# Patient Record
Sex: Female | Born: 1980 | Race: Black or African American | Hispanic: No | Marital: Single | Smoking: Never smoker
Health system: Southern US, Community
[De-identification: ages and names within clinical notes are randomized; demographics above are authoritative.]

## PROBLEM LIST (undated history)

## (undated) DIAGNOSIS — O09529 Supervision of elderly multigravida, unspecified trimester: Secondary | ICD-10-CM

## (undated) DIAGNOSIS — D649 Anemia, unspecified: Secondary | ICD-10-CM

## (undated) HISTORY — PX: BONY PELVIS SURGERY: SHX572

---

## 2000-10-06 HISTORY — PX: WRIST SURGERY: SHX841

## 2001-01-25 ENCOUNTER — Encounter: Payer: Self-pay | Admitting: Orthopedic Surgery

## 2001-01-25 ENCOUNTER — Ambulatory Visit (HOSPITAL_COMMUNITY): Admission: RE | Admit: 2001-01-25 | Discharge: 2001-01-25 | Payer: Self-pay | Admitting: Orthopedic Surgery

## 2001-03-09 ENCOUNTER — Encounter: Payer: Self-pay | Admitting: Orthopedic Surgery

## 2001-03-10 ENCOUNTER — Inpatient Hospital Stay (HOSPITAL_COMMUNITY): Admission: EM | Admit: 2001-03-10 | Discharge: 2001-03-11 | Payer: Self-pay | Admitting: Orthopedic Surgery

## 2002-07-12 ENCOUNTER — Encounter: Payer: Self-pay | Admitting: Emergency Medicine

## 2002-07-12 ENCOUNTER — Emergency Department (HOSPITAL_COMMUNITY): Admission: EM | Admit: 2002-07-12 | Discharge: 2002-07-12 | Payer: Self-pay | Admitting: Emergency Medicine

## 2004-01-06 ENCOUNTER — Ambulatory Visit (HOSPITAL_COMMUNITY): Admission: RE | Admit: 2004-01-06 | Discharge: 2004-01-06 | Payer: Self-pay | Admitting: Family Medicine

## 2004-02-29 ENCOUNTER — Emergency Department (HOSPITAL_COMMUNITY): Admission: EM | Admit: 2004-02-29 | Discharge: 2004-03-01 | Payer: Self-pay | Admitting: Emergency Medicine

## 2005-03-27 ENCOUNTER — Other Ambulatory Visit: Admission: RE | Admit: 2005-03-27 | Discharge: 2005-03-27 | Payer: Self-pay | Admitting: Obstetrics and Gynecology

## 2005-06-05 ENCOUNTER — Emergency Department (HOSPITAL_COMMUNITY): Admission: EM | Admit: 2005-06-05 | Discharge: 2005-06-05 | Payer: Self-pay | Admitting: Emergency Medicine

## 2005-10-09 ENCOUNTER — Emergency Department (HOSPITAL_COMMUNITY): Admission: EM | Admit: 2005-10-09 | Discharge: 2005-10-10 | Payer: Self-pay | Admitting: Emergency Medicine

## 2007-05-07 ENCOUNTER — Emergency Department (HOSPITAL_COMMUNITY): Admission: EM | Admit: 2007-05-07 | Discharge: 2007-05-07 | Payer: Self-pay | Admitting: Emergency Medicine

## 2011-02-21 NOTE — Discharge Summary (Signed)
[facility]  Patient:    Jessica Bryant, Jessica Bryant                          MRN: [id number] Adm. Date:  [id number] Disc. 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  PROCEDURES: 1. Left brachioradialis tenotomy. 2. Left posterior interosseous nerve neurectomy. 3. Left radius corrective osteotomy with opening wedge bone graft. 4. Left iliac crest bone graft by Dr. Amanda Pea on March 09, 2001, with Cherly Beach, P.A.-C assisting.  CONSULTATIONS:  None.  CHIEF COMPLAINT:  Left wrist pain.  HISTORY OF PRESENT ILLNESS:  The patient is a 30 year old female who had worsening left wrist pain secondary to an old injury.  She had failed conservative measures and was continuing to have discomfort in that wrist. After discussion with Dr. Amanda Pea, the patient elected to undergo left wrist corrective procedures.  She was admitted on March 09, 2001, for surgical intervention.  HOSPITAL COURSE:  Following the surgical procedure, the patient was taken to the recovery room in stable condition and then transferred to the orthopedic floor in good condition.  She did well.  On postop day #1, her left wrist drains were discontinued.  On postop day #2, the drain to her left iliac crest was withdrawn and incision was examined and found to be clean, dry and intact. Occupational therapy was consulted, evaluated and treated the patient on the first postoperative day instructing her on active and passive range of motion of her fingers.  She was initially on a Dilaudid PCA for pain management and subsequently p.o. Percocet in which she tolerated well.  She received three does of Ancef 1 g IV postop.  There were no postoperative labs.  By postop day #2, she was stable and ready for discharge  home.  DISPOSITION:  Discharged to home.  SPECIAL INSTRUCTIONS:  She is to follow cast care and splint care instructions.  She is to keep the splint clean, dry and intact.  It is not to be removed.  She is instructed on ice and elevation.  No lifting with that arm.  It is okay for her to move her fingers and she is encouraged to do so.  FOLLOWUP:  She is to follow up with Dr. Amanda Pea in 10-14 days.  She is to call [phone], for an appointment.  DIET:  Resume regular diet.  CONDITION ON DISCHARGE:  Good and improved. DD:  03/11/01 TD:  03/12/01 Job: [id number]

## 2011-02-21 NOTE — Op Note (Signed)
[facility]  Patient:    Jessica Bryant, Jessica Bryant                          MRN: [id number] Proc. Date: 03/09/01 Adm. 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  PROCEDURE: 1. Left brachial radialis tendon tenotomy. 2. Left posterior interosseous nerve neurectomy. 3. Left radius corrective osteotomy with opening wedge bone graft. 4. Left iliac crest bone graft autologous in nature for the opening wedge    bone grafting.  SURGEON:  Dominica Severin, M.D.  ASSISTANT:  Jessica Bryant, P.A.-C.  COMPLICATIONS:  None.  TOURNIQUET TIME:  Less than two hours.  ANESTHESIA:  General.  DRAINS:  Two in both the left hip and left wrist operative sites.  INDICATIONS FOR PROCEDURE:  This patient is a 30 year old black female who presents with the above mentioned diagnosis. I have counselled her extensively in regards to the surgery including the risks and benefits of bleeding, infection, anesthesia, damage to normal structures and failure of the surgery to accomplish its intended goals of relieving symptoms and restoring function. With this in mind, she desires to proceed. We have obtained preoperative CT and plain film. I have discussed with she and the family that this will likely be a stage operation with correction of the radius. Following this, we will allow this to heal. She will need subsequent plate removal and we will consider shortening osteotomy at that time if she should need it. I have discussed with them the risk of a tendon rupture due to plate prominence, etc. They do understand the plate will need to come out in three to six months after healing. They  understand the risk of the iliac bone graft, etc. We have had a long and detailed counselling session in regards to all issues. The patient and her family desire to proceed.  DESCRIPTION OF PROCEDURE:  The patient had a malunion in the left radius. She underwent correction as described below. I was happy with her motion on the table. She had excellent stability. Under live fluoro, there was no displacement and good purchase of her plate and screws. She had ample bone graft placed and good stability. She achieved good correction of her radial inclination and volar tilt, radial height remained at a 3 mm ulnar positive variance at the end. This was quite a dramatic improvement from her preoperative status.  DESCRIPTION OF PROCEDURE:  The patient was seen by myself and anesthesia. She was taken to the operative suite and underwent preoperative antibiotics and was then laid supine, smooth induction of general endotracheal anesthesia ensued. Following this, she was appropriately padded and the left hip and left upper extremity were prepped and draped in the usual sterile fashion. Once this was done, the tourniquet was insufflated to 270 mmHg approximately after the arm was elevated. A midline incision was made. Access to the interval between the third and fourth dorsal compartment was created. Once this was done, the patient had dissection over to the brachial radialis which was quite contracted. This underwent a tenotomy in  the form of a Z type lengthening. Following this tenotomy, the patient underwent a posterior interosseous nerve neurectomy using crushing and cauterization technique. Following this, the patient underwent access to the radius between the third and fourth dorsal compartments. The EPL was transposed, the extensor retinaculum was Z lengthened to allow for coverage of the plate later in the operation. Once this was done, the patient had the radius identified, retractors were  placed and outline marks were made for the corrective osteotomy site. Two pins were placed along the articular surface of the distal radius under live fluoro. Once this was done, the osteotomy cut was made to provide ample room for screws distally. The patient then had the opening wedge performed. I should note that the osteotomy was made through the dorsal and medial and lateral sides of the radius. The volar cortex was not completely osteotomized. This fits with standard technique for corrective osteotomy about the radius. I took care to leave a volar bridge of periosteum for stability purposes. Once this was done, I checked under live fluoro the correction and the correction was excellent in terms of volar tilt and radial inclination. She had some ulnar positive variant still left but quite a dramatic improvement was noted.  Once this was done, the patient had the iliac crest approached. This was done by incising the skin. A large amount of subcu was dissected until the fascia was identified and it was then split and iliac crest bone graft was taken with standard technique. The bone graft was taken utilizing the oscillating saw as well as the osteotomy and curette. The patient had nice tricortical bone graft as well as cancellous bone graft taken without difficulty. Following this, this was irrigated. An ______ Hemovac drain was placed, bone wax, Gelfoam with thrombin and layered closure with #0 Vicryl, 2-0 Vicryl and the staple gun was performed. This was done I should note at the very end of the case after the plate was applied to the radius. Her hemostasis was excellent and I was very happy with the overall conditions of her hip. However, I should note that she did have a large amount of adipose tissue and we will need to watch her for any donor site morbidity, etc. I discussed this with them preoperatively.  Following harvesting the iliac crest bone graft, the tricortical wedge  was shapened with the oscillating saw rongeur, etc. and then the lamina spreader was placed and the corrective osteotomy site had placement of the tricortical  bone graft around it. The patient had packing of large amounts of cancellous bone graft done to my satisfaction without difficulty. I was very pleased with the amount of bone graft able to be placed in the area and the correction. Following this, a small LO- ______ plate was applied, screws were placed proximally and distally and achieved excellent bicortical purchase utilizing standard AO technique. I was pleased radiographically with the correction plate fit, etc. This plate was bending appropriately and fine adjustments were made accordingly. Once the plate was applied, the patient underwent live fluoroscopy to demonstrate stability. This was noted to be the case. Volar tilt had been restored to an excellent position. Radial inclination had also been restored. She had some 2-3 mm ulnar positive variance but certainly a dramatic correction from preoperative status. I was very satisfied at this point. The patient had hemostasis obtained with bipolar electrocautery and then underwent closure of the extensor retinaculum over the plate. This was previously Z lengthened. The plate was covered  distally entirely with retinaculum to prevent ______ tendon rupture, etc. The EPL was left transposed and sat away from all elements of the plate. The ______ compartment was protected from the plate. The ECRB sat somewhat adjacent to the plate; however, it had a nice layer of tissue between it and the plate and hopefully there will be no problems with tendon gliding, etc. postoperatively. Once the periosteal areas and the retinaculum were closed, the patient underwent copious irrigation followed by leaving the EPL in the transposed state and closing the wound over a TLS drain with 2-0 Vicryl followed by Prolene at the skin edge. The drain was  hooked up to suction. Marcaine with epinephrine was placed in the hip wound. A 0.5% and 0.25% Marcaine without epinephrine was placed in the left hand/wrist area. The patient tolerated this well. A sterile compressive dressing and plaster split was applied without difficulty to the left upper extremity. She was awakened from anesthesia, extubated and transferred to the recovery room in stable condition at the conclusion of the case. She will be monitored postoperatively with IV antibiotics, pain management, etc. She will notify me should any problems, questions or concerns arise. We will monitor her condition closely. I have discussed all issues with the family and should note that I was pleased with her correction intraoperatively. All questions have been encouraged and answered. DD:  03/09/01 TD:  03/10/01 Job: [id number]

## 2017-10-06 NOTE — L&D Delivery Note (Signed)
Delivery Note  Pt complete with uncontrollable urge to push. She pushed for 30-40 mins and at 11:00 PM a viable female was delivered via Vaginal, Spontaneous (Presentation: OA;  ).  APGAR: 9,9 ; weight pending  .   Placenta status: delivered intact, schultz, .  Cord:3vc  with the following complications: none;  Cord pH: n/a  Anesthesia:  Epidural Episiotomy: None Lacerations:  none Est. Blood Loss (mL): 150  Mom to postpartum.  Baby to Couplet care / Skin to Skin.  Cathrine MusterCecilia W Travor Royce 04/21/2018, 11:20 PM

## 2017-10-19 LAB — OB RESULTS CONSOLE ABO/RH: RH TYPE: POSITIVE

## 2017-10-19 LAB — OB RESULTS CONSOLE HIV ANTIBODY (ROUTINE TESTING): HIV: NONREACTIVE

## 2017-10-19 LAB — OB RESULTS CONSOLE RPR: RPR: NONREACTIVE

## 2017-10-19 LAB — OB RESULTS CONSOLE ANTIBODY SCREEN: Antibody Screen: NEGATIVE

## 2017-10-19 LAB — OB RESULTS CONSOLE GC/CHLAMYDIA
Chlamydia: NEGATIVE
Gonorrhea: NEGATIVE

## 2017-10-19 LAB — OB RESULTS CONSOLE HEPATITIS B SURFACE ANTIGEN: Hepatitis B Surface Ag: NEGATIVE

## 2017-10-19 LAB — OB RESULTS CONSOLE RUBELLA ANTIBODY, IGM: Rubella: IMMUNE

## 2017-10-22 DIAGNOSIS — D649 Anemia, unspecified: Secondary | ICD-10-CM | POA: Insufficient documentation

## 2017-10-25 ENCOUNTER — Other Ambulatory Visit: Payer: Self-pay

## 2017-11-23 ENCOUNTER — Other Ambulatory Visit (HOSPITAL_COMMUNITY): Payer: Self-pay | Admitting: Obstetrics and Gynecology

## 2017-11-23 DIAGNOSIS — O09522 Supervision of elderly multigravida, second trimester: Secondary | ICD-10-CM

## 2017-11-23 DIAGNOSIS — O09292 Supervision of pregnancy with other poor reproductive or obstetric history, second trimester: Secondary | ICD-10-CM

## 2017-11-23 DIAGNOSIS — Z3689 Encounter for other specified antenatal screening: Secondary | ICD-10-CM

## 2017-11-23 DIAGNOSIS — Z3A19 19 weeks gestation of pregnancy: Secondary | ICD-10-CM

## 2017-11-26 ENCOUNTER — Encounter (HOSPITAL_COMMUNITY): Payer: Self-pay | Admitting: Obstetrics and Gynecology

## 2017-12-01 ENCOUNTER — Encounter (HOSPITAL_COMMUNITY): Payer: Self-pay | Admitting: *Deleted

## 2017-12-03 ENCOUNTER — Encounter (HOSPITAL_COMMUNITY): Payer: Self-pay

## 2017-12-03 ENCOUNTER — Other Ambulatory Visit: Payer: Self-pay

## 2017-12-03 ENCOUNTER — Ambulatory Visit (HOSPITAL_COMMUNITY)
Admission: RE | Admit: 2017-12-03 | Discharge: 2017-12-03 | Disposition: A | Discharge status: Home / Self Care | Payer: Medicaid Other | Source: Ambulatory Visit | Attending: Obstetrics and Gynecology | Admitting: Obstetrics and Gynecology

## 2017-12-03 ENCOUNTER — Other Ambulatory Visit (HOSPITAL_COMMUNITY): Payer: Self-pay | Admitting: Obstetrics and Gynecology

## 2017-12-03 DIAGNOSIS — O09512 Supervision of elderly primigravida, second trimester: Secondary | ICD-10-CM | POA: Diagnosis not present

## 2017-12-03 DIAGNOSIS — Z3689 Encounter for other specified antenatal screening: Secondary | ICD-10-CM

## 2017-12-03 DIAGNOSIS — O289 Unspecified abnormal findings on antenatal screening of mother: Secondary | ICD-10-CM | POA: Insufficient documentation

## 2017-12-03 DIAGNOSIS — Z315 Encounter for genetic counseling: Secondary | ICD-10-CM | POA: Insufficient documentation

## 2017-12-03 DIAGNOSIS — Z3A19 19 weeks gestation of pregnancy: Secondary | ICD-10-CM | POA: Insufficient documentation

## 2017-12-03 DIAGNOSIS — O09292 Supervision of pregnancy with other poor reproductive or obstetric history, second trimester: Secondary | ICD-10-CM

## 2017-12-03 DIAGNOSIS — O09522 Supervision of elderly multigravida, second trimester: Secondary | ICD-10-CM

## 2017-12-03 DIAGNOSIS — O281 Abnormal biochemical finding on antenatal screening of mother: Secondary | ICD-10-CM

## 2017-12-03 DIAGNOSIS — O99212 Obesity complicating pregnancy, second trimester: Secondary | ICD-10-CM | POA: Insufficient documentation

## 2017-12-03 DIAGNOSIS — Z3686 Encounter for antenatal screening for cervical length: Secondary | ICD-10-CM

## 2017-12-03 DIAGNOSIS — O09519 Supervision of elderly primigravida, unspecified trimester: Secondary | ICD-10-CM

## 2017-12-03 HISTORY — DX: Anemia, unspecified: D64.9

## 2017-12-04 DIAGNOSIS — O09519 Supervision of elderly primigravida, unspecified trimester: Secondary | ICD-10-CM | POA: Insufficient documentation

## 2017-12-04 DIAGNOSIS — Z3A19 19 weeks gestation of pregnancy: Secondary | ICD-10-CM | POA: Insufficient documentation

## 2017-12-04 NOTE — Progress Notes (Signed)
Genetic Counseling  High-Risk Gestation Note  Appointment Date:  12/03/2017 Referred By: Edwinna Areola, * Date of Birth:  Mar 01, 1981 Partner:  Jessica Bryant   Pregnancy History: G1P0 Estimated Date of Delivery: 04/29/18 Estimated Gestational Age: [redacted]w[redacted]d Attending: Particia Nearing, MD   Ms. Jessica Bryant was referred for genetic counseling given advanced maternal age and given no results on noninvasive prenatal screening (NIPS) due to insufficient fetal DNA.    In summary:   Discussed AMA and associated risk for fetal aneuploidy  Reviewed previous NIPS attempts and low fetal fraction  Reviewed various etiologies for low fetal fraction including gestational age, maternal BMI, maternal medication use, underlying fetal aneuploidy and other unknown factors  Discussed options for screening  Quad screen-declined  Repeat NIPS- declined today  Ultrasound- performed today; see separate report  Discussed diagnostic testing options  Amniocentesis- declined  Reviewed family history concerns   She was counseled regarding maternal age and the association with risk for chromosome conditions due to nondisjunction with aging of the ova.   We reviewed chromosomes, nondisjunction, and the associated 1 in 111 risk for fetal aneuploidy related to a maternal age of 37 y.o. at [redacted]w[redacted]d gestation.  She was counseled that the risk for aneuploidy decreases as gestational age increases, accounting for those pregnancies which spontaneously abort.  We specifically discussed Down syndrome (trisomy 55), trisomies 27 and 77, and sex chromosome aneuploidies (47,XXX and 47,XXY) including the common features and prognoses of each.   Ms. Jessica Bryant previously had noninvasive prenatal screening (NIPS)/cell free DNA testing attempted through her OB office at 13 weeks and [redacted]w[redacted]d gestation. Both were reported to have a low fetal fraction (2.6% and 2.7%), and thus, no result was obtained from this screening. We reviewed the  methodology of NIPS and discussed differential diagnoses for low fetal fraction including maternal obesity, physiological differences in maternal blood, maternal use of anticoagulants in pregnancy, underlying maternal or fetal aneuploidy. Additionally, we discussed that there may be additional factors that impact fetal fraction in pregnancy that are not yet reported in the medical literature. We discussed that on average, fetal fraction is expected to increase incrementally throughout gestation.    We reviewed available screening options including repeat NIPS given the further gestation but also discussed that there still remains a possibility of detecting a low fetal fraction. We also discussed screening options of Quad screen and detailed ultrasound.  She was counseled that screening tests are used to modify a patient's a priori risk for aneuploidy, typically based on age. This estimate provides a pregnancy specific risk assessment. We reviewed the benefits and limitations of each option. Specifically, we discussed the conditions for which each test screens, the detection rates, and false positive rates of each. She was also counseled regarding diagnostic testing via amniocentesis. We reviewed the approximate 1 in 300-500 risk for complications from amniocentesis, including spontaneous pregnancy loss. We discussed the possible results that the tests might provide including: positive, negative, unanticipated, and no result. Finally, they were counseled regarding the cost of each option and potential out of pocket expenses. After consideration of all the options, she elected to pursue ultrasound only and declined all additional screening and testing for fetal aneuploidy including NIPS redraw, Quad screen, and amniocentesis.     A complete ultrasound was performed today. The ultrasound report will be sent under separate cover. She understands that screening tests cannot rule out all birth defects or genetic  syndromes. The patient was advised of this limitation and states she still does  not want additional testing at this time.   The patient reported a family history of sickle cell on her father's side of the family. We reviewed that hemoglobin electrophoresis was previously performed for MS. Jessica Bryant and was within normal limits, indicating that the current pregnancy is not  At increased risk for hemoglobinopathies. Detailed pedigree Holiday representativeconstruction and analysis was not able to be performed today given time constraints on today's visit. The patient reported African American ancestry for herself and her partner, and no known consanguinity for the couple. Without further information regarding the provided family history, an accurate genetic risk cannot be calculated. Further genetic counseling is warranted if more information is obtained.  Ms. Jessica Bryant denied exposure to environmental toxins or chemical agents. She denied the use of alcohol, tobacco or street drugs. She denied significant viral illnesses during the course of her pregnancy.   I counseled Ms. Jessica Bryant regarding the above risks and available options.  The approximate face-to-face time with the genetic counselor was 40 minutes.  Quinn PlowmanKaren Saxon Barich, MS,  Certified Genetic Counselor 12/04/2017

## 2017-12-10 ENCOUNTER — Ambulatory Visit (HOSPITAL_COMMUNITY): Payer: Self-pay

## 2018-03-22 LAB — OB RESULTS CONSOLE GBS: STREP GROUP B AG: NEGATIVE

## 2018-04-05 ENCOUNTER — Encounter (HOSPITAL_COMMUNITY): Payer: Self-pay

## 2018-04-06 ENCOUNTER — Encounter (HOSPITAL_COMMUNITY): Payer: Self-pay

## 2018-04-19 ENCOUNTER — Encounter (HOSPITAL_COMMUNITY)
Admission: RE | Admit: 2018-04-19 | Discharge: 2018-04-19 | Disposition: A | Discharge status: Home / Self Care | Payer: Medicaid Other | Source: Ambulatory Visit | Attending: Obstetrics and Gynecology | Admitting: Obstetrics and Gynecology

## 2018-04-19 HISTORY — DX: Supervision of elderly multigravida, unspecified trimester: O09.529

## 2018-04-20 ENCOUNTER — Encounter (HOSPITAL_COMMUNITY): Admission: RE | Payer: Self-pay | Source: Ambulatory Visit

## 2018-04-20 ENCOUNTER — Inpatient Hospital Stay (HOSPITAL_COMMUNITY)
Admission: RE | Admit: 2018-04-20 | Payer: Medicaid Other | Source: Ambulatory Visit | Admitting: Obstetrics and Gynecology

## 2018-04-20 SURGERY — Surgical Case
Anesthesia: Spinal

## 2018-04-21 ENCOUNTER — Encounter (HOSPITAL_COMMUNITY): Payer: Self-pay

## 2018-04-21 ENCOUNTER — Inpatient Hospital Stay (HOSPITAL_COMMUNITY): Payer: Medicaid Other | Admitting: Anesthesiology

## 2018-04-21 ENCOUNTER — Inpatient Hospital Stay (HOSPITAL_COMMUNITY)
Admission: RE | Admit: 2018-04-21 | Discharge: 2018-04-23 | DRG: 807 | Disposition: A | Discharge status: Home / Self Care | Payer: Medicaid Other | Attending: Obstetrics and Gynecology | Admitting: Obstetrics and Gynecology

## 2018-04-21 DIAGNOSIS — O99214 Obesity complicating childbirth: Principal | ICD-10-CM | POA: Diagnosis present

## 2018-04-21 DIAGNOSIS — O09519 Supervision of elderly primigravida, unspecified trimester: Secondary | ICD-10-CM

## 2018-04-21 DIAGNOSIS — Z3490 Encounter for supervision of normal pregnancy, unspecified, unspecified trimester: Secondary | ICD-10-CM

## 2018-04-21 DIAGNOSIS — O26893 Other specified pregnancy related conditions, third trimester: Secondary | ICD-10-CM | POA: Diagnosis present

## 2018-04-21 DIAGNOSIS — D649 Anemia, unspecified: Secondary | ICD-10-CM | POA: Diagnosis present

## 2018-04-21 DIAGNOSIS — Z3A39 39 weeks gestation of pregnancy: Secondary | ICD-10-CM | POA: Diagnosis not present

## 2018-04-21 DIAGNOSIS — O9902 Anemia complicating childbirth: Secondary | ICD-10-CM | POA: Diagnosis present

## 2018-04-21 LAB — TYPE AND SCREEN
ABO/RH(D): A POS
ANTIBODY SCREEN: NEGATIVE

## 2018-04-21 LAB — CBC
HCT: 34.3 % — ABNORMAL LOW (ref 36.0–46.0)
HEMOGLOBIN: 11.1 g/dL — AB (ref 12.0–15.0)
MCH: 26.4 pg (ref 26.0–34.0)
MCHC: 32.4 g/dL (ref 30.0–36.0)
MCV: 81.7 fL (ref 78.0–100.0)
Platelets: 214 10*3/uL (ref 150–400)
RBC: 4.2 MIL/uL (ref 3.87–5.11)
RDW: 15.8 % — ABNORMAL HIGH (ref 11.5–15.5)
WBC: 8.7 10*3/uL (ref 4.0–10.5)

## 2018-04-21 LAB — RPR: RPR Ser Ql: NONREACTIVE

## 2018-04-21 LAB — ABO/RH: ABO/RH(D): A POS

## 2018-04-21 MED ORDER — EPHEDRINE 5 MG/ML INJ
10.0000 mg | INTRAVENOUS | Status: DC | PRN
  Filled 2018-04-21: qty 2

## 2018-04-21 MED ORDER — FENTANYL 2.5 MCG/ML BUPIVACAINE 1/10 % EPIDURAL INFUSION (WH - ANES)
14.0000 mL/h | INTRAMUSCULAR | Status: DC | PRN
  Administered 2018-04-21: 15 mL/h via EPIDURAL
  Administered 2018-04-21: 14 mL/h via EPIDURAL
  Filled 2018-04-21 (×2): qty 100

## 2018-04-21 MED ORDER — OXYTOCIN 40 UNITS IN LACTATED RINGERS INFUSION - SIMPLE MED
1.0000 m[IU]/min | INTRAVENOUS | Status: DC
  Administered 2018-04-21: 2 m[IU]/min via INTRAVENOUS

## 2018-04-21 MED ORDER — OXYTOCIN BOLUS FROM INFUSION
500.0000 mL | Freq: Once | INTRAVENOUS | Status: AC
  Administered 2018-04-21: 500 mL via INTRAVENOUS

## 2018-04-21 MED ORDER — TERBUTALINE SULFATE 1 MG/ML IJ SOLN
0.2500 mg | Freq: Once | INTRAMUSCULAR | Status: DC | PRN
  Filled 2018-04-21: qty 1

## 2018-04-21 MED ORDER — DIPHENHYDRAMINE HCL 50 MG/ML IJ SOLN: 12.5000 mg | INTRAMUSCULAR | Status: DC | PRN

## 2018-04-21 MED ORDER — FLEET ENEMA 7-19 GM/118ML RE ENEM: 1.0000 | ENEMA | RECTAL | Status: DC | PRN

## 2018-04-21 MED ORDER — BUTORPHANOL TARTRATE 1 MG/ML IJ SOLN
1.0000 mg | INTRAMUSCULAR | Status: DC | PRN
  Administered 2018-04-21: 1 mg via INTRAVENOUS
  Filled 2018-04-21: qty 1

## 2018-04-21 MED ORDER — LACTATED RINGERS IV SOLN
500.0000 mL | INTRAVENOUS | Status: DC | PRN
  Administered 2018-04-21: 500 mL via INTRAVENOUS

## 2018-04-21 MED ORDER — OXYCODONE-ACETAMINOPHEN 5-325 MG PO TABS: 1.0000 | ORAL_TABLET | ORAL | Status: DC | PRN

## 2018-04-21 MED ORDER — OXYTOCIN 40 UNITS IN LACTATED RINGERS INFUSION - SIMPLE MED
2.5000 [IU]/h | INTRAVENOUS | Status: DC
  Filled 2018-04-21: qty 1000

## 2018-04-21 MED ORDER — ACETAMINOPHEN 325 MG PO TABS: 650.0000 mg | ORAL_TABLET | ORAL | Status: DC | PRN

## 2018-04-21 MED ORDER — IBUPROFEN 600 MG PO TABS
600.0000 mg | ORAL_TABLET | Freq: Four times a day (QID) | ORAL | Status: DC
  Administered 2018-04-22 – 2018-04-23 (×5): 600 mg via ORAL
  Filled 2018-04-21 (×5): qty 1

## 2018-04-21 MED ORDER — LIDOCAINE HCL (PF) 1 % IJ SOLN
INTRAMUSCULAR | Status: DC | PRN
  Administered 2018-04-21: 5 mL via EPIDURAL
  Administered 2018-04-21: 4 mL via EPIDURAL

## 2018-04-21 MED ORDER — LACTATED RINGERS IV SOLN: 500.0000 mL | Freq: Once | INTRAVENOUS | Status: DC

## 2018-04-21 MED ORDER — LACTATED RINGERS AMNIOINFUSION
Freq: Once | INTRAVENOUS | Status: AC
  Administered 2018-04-21: 300 mL via INTRAUTERINE

## 2018-04-21 MED ORDER — ONDANSETRON HCL 4 MG/2ML IJ SOLN: 4.0000 mg | Freq: Four times a day (QID) | INTRAMUSCULAR | Status: DC | PRN

## 2018-04-21 MED ORDER — OXYCODONE-ACETAMINOPHEN 5-325 MG PO TABS: 2.0000 | ORAL_TABLET | ORAL | Status: DC | PRN

## 2018-04-21 MED ORDER — PHENYLEPHRINE 40 MCG/ML (10ML) SYRINGE FOR IV PUSH (FOR BLOOD PRESSURE SUPPORT)
80.0000 ug | PREFILLED_SYRINGE | INTRAVENOUS | Status: DC | PRN
  Filled 2018-04-21: qty 5

## 2018-04-21 MED ORDER — LACTATED RINGERS IV SOLN
500.0000 mL | Freq: Once | INTRAVENOUS | Status: AC
  Administered 2018-04-21: 500 mL via INTRAVENOUS

## 2018-04-21 MED ORDER — PHENYLEPHRINE 40 MCG/ML (10ML) SYRINGE FOR IV PUSH (FOR BLOOD PRESSURE SUPPORT)
80.0000 ug | PREFILLED_SYRINGE | INTRAVENOUS | Status: DC | PRN
  Filled 2018-04-21: qty 10
  Filled 2018-04-21: qty 5

## 2018-04-21 MED ORDER — LIDOCAINE HCL (PF) 1 % IJ SOLN
30.0000 mL | INTRAMUSCULAR | Status: DC | PRN
  Filled 2018-04-21: qty 30

## 2018-04-21 MED ORDER — SOD CITRATE-CITRIC ACID 500-334 MG/5ML PO SOLN: 30.0000 mL | ORAL | Status: DC | PRN

## 2018-04-21 MED ORDER — LACTATED RINGERS IV SOLN
INTRAVENOUS | Status: DC
  Administered 2018-04-21 (×3): via INTRAVENOUS

## 2018-04-21 NOTE — Anesthesia Preprocedure Evaluation (Signed)
Anesthesia Evaluation  Patient identified by MRN, date of birth, ID band Patient awake    Reviewed: Allergy & Precautions, Patient's Chart, lab work & pertinent test results  Airway Mallampati: II  TM Distance: >3 FB Neck ROM: Full    Dental no notable dental hx. (+) Teeth Intact   Pulmonary neg pulmonary ROS,    Pulmonary exam normal breath sounds clear to auscultation       Cardiovascular hypertension, Normal cardiovascular exam Rhythm:Regular Rate:Normal     Neuro/Psych negative neurological ROS  negative psych ROS   GI/Hepatic Neg liver ROS,   Endo/Other  Morbid obesitySuper MO  Renal/GU negative Renal ROS  negative genitourinary   Musculoskeletal negative musculoskeletal ROS (+)   Abdominal (+) + obese,   Peds  Hematology  (+) anemia ,   Anesthesia Other Findings   Reproductive/Obstetrics (+) Pregnancy AMA                             Anesthesia Physical Anesthesia Plan  ASA: III  Anesthesia Plan: Epidural   Post-op Pain Management:    Induction:   PONV Risk Score and Plan:   Airway Management Planned: Natural Airway  Additional Equipment:   Intra-op Plan:   Post-operative Plan:   Informed Consent: I have reviewed the patients History and Physical, chart, labs and discussed the procedure including the risks, benefits and alternatives for the proposed anesthesia with the patient or authorized representative who has indicated his/her understanding and acceptance.     Plan Discussed with: Anesthesiologist  Anesthesia Plan Comments:         Anesthesia Quick Evaluation

## 2018-04-21 NOTE — H&P (Signed)
Jessica Bryant is a 37 y.o. female presenting for elective iol at term with favorable cervix. Pt dated per LMP and confirmed with 7 week US. Pregnancy complicated bu obesity, borderline low afi that resolved, anemia. Hx kidney disease; BP controlled in pregnancy. Baby flipped from breech to vertex at  37 weeks. GBS neg. Panorama and essential panel nl  OB History    Gravida  1   Para      Term      Preterm      AB      Living  0     SAB      TAB      Ectopic      Multiple      Live Births             Past Medical History:  Diagnosis Date  . AMA (advanced maternal age) multigravida 35+   . Anemia    Past Surgical History:  Procedure Laterality Date  . BONY PELVIS SURGERY    . WRIST SURGERY  2002   Family History: family history includes Cancer in her maternal grandmother; Heart disease in her mother. Social History:  reports that she has never smoked. She has never used smokeless tobacco. She reports that she does not drink alcohol or use drugs.     Maternal Diabetes: No Genetic Screening: Normal Maternal Ultrasounds/Referrals: Normal Fetal Ultrasounds or other Referrals:  None Maternal Substance Abuse:  No Significant Maternal Medications:  None Significant Maternal Lab Results:  Lab values include: Group B Strep negative Other Comments:  None  Review of Systems  Constitutional: Positive for malaise/fatigue. Negative for chills, fever and weight loss.  HENT: Negative for hearing loss.   Eyes: Negative for blurred vision and double vision.  Respiratory: Positive for shortness of breath.   Cardiovascular: Positive for leg swelling. Negative for chest pain.  Gastrointestinal: Positive for abdominal pain. Negative for heartburn, nausea and vomiting.  Genitourinary: Negative for dysuria.  Musculoskeletal: Positive for back pain and myalgias.  Skin: Negative for itching and rash.  Neurological: Negative for dizziness and headaches.  Endo/Heme/Allergies: Does  not bruise/bleed easily.  Psychiatric/Behavioral: Negative for depression, hallucinations, substance abuse and suicidal ideas. The patient is not nervous/anxious.    Maternal Medical History:  Reason for admission: Nausea. Elective iol; favorable cervix  Contractions: Onset was 1-2 hours ago.   Frequency: irregular.   Perceived severity is mild.    Fetal activity: Perceived fetal activity is normal.   Last perceived fetal movement was within the past hour.    Prenatal complications: Oligohydramnios (borderline - resolved).   Prenatal Complications - Diabetes: none.    Dilation: 2 Effacement (%): 70 Station: -3 Exam by:: A Showfety RN Blood pressure 139/88, pulse 65, temperature 98.3 F (36.8 C), temperature source Oral, resp. rate 18, height 5\' 10"  (1.778 m), weight (!) 377 lb (171 kg), last menstrual period 07/23/2017. Maternal Exam:  Uterine Assessment: Contraction strength is mild.  Contraction frequency is irregular.   Abdomen: Patient reports generalized tenderness.  Estimated fetal weight is AGA.   Fetal presentation: vertex  Introitus: Normal vulva. Vulva is negative for condylomata and lesion.  Normal vagina.  Vagina is negative for condylomata.  Amniotic fluid character: clear.  Pelvis: adequate for delivery.   Cervix: Cervix evaluated by digital exam.     Physical Exam  Constitutional: She is oriented to person, place, and time. She appears well-nourished.  Neck: Normal range of motion.  Cardiovascular: Normal rate.  Respiratory: Effort  normal.  GI: Soft. There is generalized tenderness.  Genitourinary: Vagina normal and uterus normal. Vulva exhibits no lesion.  Musculoskeletal: Normal range of motion. She exhibits edema.  Neurological: She is alert and oriented to person, place, and time.  Skin: Skin is warm.  Psychiatric: She has a normal mood and affect. Her behavior is normal. Judgment and thought content normal.    Prenatal labs: ABO, Rh: --/--/A  POS (07/17 0734) Antibody: NEG (07/17 0734) Rubella: Immune (01/14 0000) RPR: Nonreactive (01/14 0000)  HBsAg: Negative (01/14 0000)  HIV: Non-reactive (01/14 0000)  GBS: Negative (06/17 0000)   Assessment/Plan: 37yo G1P0 at 39 2/[redacted]wks gestation - iol; elective Place IUPC after ROM Pit per protocol Anticipate svd    Jessica Bryant Banga 04/21/2018, 9:46 AM

## 2018-04-21 NOTE — Anesthesia Procedure Notes (Signed)
Epidural Patient location during procedure: OB Start time: 04/21/2018 1:33 PM  Staffing Anesthesiologist: Mal AmabileFoster, Mekiyah Gladwell, MD Performed: anesthesiologist   Preanesthetic Checklist Completed: patient identified, site marked, surgical consent, pre-op evaluation, timeout performed, IV checked, risks and benefits discussed and monitors and equipment checked  Epidural Patient position: sitting Prep: site prepped and draped and DuraPrep Patient monitoring: continuous pulse ox and blood pressure Approach: midline Location: L3-L4 Injection technique: LOR air  Needle:  Needle type: Tuohy  Needle gauge: 17 G Needle length: 15 cm and 9 Needle insertion depth: 5 cm and 11 cm Catheter type: closed end flexible Catheter size: 19 Gauge Catheter at skin depth: 10 and 18 cm Test dose: negative and Other  Assessment Events: blood not aspirated, injection not painful, no injection resistance, negative IV test and no paresthesia  Additional Notes Patient identified. Risks and benefits discussed including failed block, incomplete  Pain control, post dural puncture headache, nerve damage, paralysis, blood pressure Changes, nausea, vomiting, reactions to medications-both toxic and allergic and post Partum back pain. All questions were answered. Patient expressed understanding and wished to proceed. Sterile technique was used throughout procedure. Epidural site was Dressed with sterile barrier dressing. No paresthesias, signs of intravascular injection Or signs of intrathecal spread were encountered. Attempt x 3. Difficult due to Super MO and lack of landmarks. Had to use long epidural needle. Patient was more comfortable after the epidural was dosed. Please see RN's note for documentation of vital signs and FHR which are stable.

## 2018-04-21 NOTE — Progress Notes (Signed)
Patient ID: Jessica DawsonEvette T Bryant, female   DOB: 15-Jul-1981, 37 y.o.   MRN: [id number] Pt reports starting to appreciate stronger contractions. +FMs. Doing well. Denies HA  BP 139/88 EFM - 140s, cat 1 TOCO - difficult to note contractions SVE - 3/80/-2  A/P: IUP at 38 6/7wks         AROM clear fluid         IUPC placed         Monitor BP         Anticipate svd

## 2018-04-21 NOTE — Anesthesia Pain Management Evaluation Note (Signed)
  CRNA Pain Management Visit Note  Patient: Jessica Bryant, 37 y.o., female  "Hello I am a member of the anesthesia team at [facility]. We have an anesthesia team available at all times to provide care throughout the hospital, including epidural management and anesthesia for C-section. I don't know your plan for the delivery whether it a natural birth, water birth, IV sedation, nitrous supplementation, doula or epidural, but we want to meet your pain goals."   1.Was your pain managed to your expectations on prior hospitalizations?   No prior hospitalizations  2.What is your expectation for pain management during this hospitalization?     Epidural  3.How can we help you reach that goal? epidural  Record the patient's initial score and the patient's pain goal.   Pain: 0  Pain Goal: 4 [facility] wants you to be able to say your pain was always managed very well.  Jessica Bryant 04/21/2018

## 2018-04-21 NOTE — Progress Notes (Signed)
Patient ID: Jessica DawsonEvette T Wofford, female   DOB: 1981/08/31, 37 y.o.   MRN: [id number] Pt comfortable with epidural. No complaints VSS 126-139/71-78 EFM - cat 1, 140s TOCO - irregular contractions q 1-104mins SVE 4/90/-2  A/P: Prime at term progressing in labor         Continue pitocin per protocol         Anticipate svd

## 2018-04-21 NOTE — Progress Notes (Signed)
Patient ID: Jessica DawsonEvette T Bryant, female   DOB: October 04, 1981, 37 y.o.   MRN: [id number] Pt comfortable with epidural. +Fms VSS EFM - cat 1 140s TOCO - regualr contractions SVE 6/90/-2 Pit at 8mus  Continue current plan Anticipate svd

## 2018-04-22 ENCOUNTER — Encounter (HOSPITAL_COMMUNITY): Payer: Self-pay

## 2018-04-22 LAB — CBC
HCT: 31.8 % — ABNORMAL LOW (ref 36.0–46.0)
Hemoglobin: 10.4 g/dL — ABNORMAL LOW (ref 12.0–15.0)
MCH: 26.6 pg (ref 26.0–34.0)
MCHC: 32.7 g/dL (ref 30.0–36.0)
MCV: 81.3 fL (ref 78.0–100.0)
PLATELETS: 196 10*3/uL (ref 150–400)
RBC: 3.91 MIL/uL (ref 3.87–5.11)
RDW: 16.1 % — AB (ref 11.5–15.5)
WBC: 13.2 10*3/uL — AB (ref 4.0–10.5)

## 2018-04-22 MED ORDER — DIPHENHYDRAMINE HCL 25 MG PO CAPS: 25.0000 mg | ORAL_CAPSULE | Freq: Four times a day (QID) | ORAL | Status: DC | PRN

## 2018-04-22 MED ORDER — SIMETHICONE 80 MG PO CHEW: 80.0000 mg | CHEWABLE_TABLET | ORAL | Status: DC | PRN

## 2018-04-22 MED ORDER — DIBUCAINE 1 % RE OINT: 1.0000 "application " | TOPICAL_OINTMENT | RECTAL | Status: DC | PRN

## 2018-04-22 MED ORDER — SENNOSIDES-DOCUSATE SODIUM 8.6-50 MG PO TABS: 2.0000 | ORAL_TABLET | ORAL | Status: DC

## 2018-04-22 MED ORDER — ONDANSETRON HCL 4 MG/2ML IJ SOLN: 4.0000 mg | INTRAMUSCULAR | Status: DC | PRN

## 2018-04-22 MED ORDER — WITCH HAZEL-GLYCERIN EX PADS: 1.0000 "application " | MEDICATED_PAD | CUTANEOUS | Status: DC | PRN

## 2018-04-22 MED ORDER — OXYCODONE HCL 5 MG PO TABS: 5.0000 mg | ORAL_TABLET | ORAL | Status: DC | PRN

## 2018-04-22 MED ORDER — ONDANSETRON HCL 4 MG PO TABS: 4.0000 mg | ORAL_TABLET | ORAL | Status: DC | PRN

## 2018-04-22 MED ORDER — OXYCODONE HCL 5 MG PO TABS: 10.0000 mg | ORAL_TABLET | ORAL | Status: DC | PRN

## 2018-04-22 MED ORDER — BENZOCAINE-MENTHOL 20-0.5 % EX AERO
1.0000 "application " | INHALATION_SPRAY | CUTANEOUS | Status: DC | PRN
  Administered 2018-04-22: 1 via TOPICAL
  Filled 2018-04-22: qty 56

## 2018-04-22 MED ORDER — ZOLPIDEM TARTRATE 5 MG PO TABS: 5.0000 mg | ORAL_TABLET | Freq: Every evening | ORAL | Status: DC | PRN

## 2018-04-22 MED ORDER — ACETAMINOPHEN 325 MG PO TABS
650.0000 mg | ORAL_TABLET | ORAL | Status: DC | PRN
  Filled 2018-04-22: qty 2

## 2018-04-22 MED ORDER — TETANUS-DIPHTH-ACELL PERTUSSIS 5-2.5-18.5 LF-MCG/0.5 IM SUSP: 0.5000 mL | Freq: Once | INTRAMUSCULAR | Status: DC

## 2018-04-22 MED ORDER — PRENATAL MULTIVITAMIN CH
1.0000 | ORAL_TABLET | Freq: Every day | ORAL | Status: DC
  Administered 2018-04-22 – 2018-04-23 (×2): 1 via ORAL
  Filled 2018-04-22 (×2): qty 1

## 2018-04-22 MED ORDER — COCONUT OIL OIL: 1.0000 "application " | TOPICAL_OIL | Status: DC | PRN

## 2018-04-22 NOTE — Lactation Note (Signed)
This note was copied from a baby's chart. Lactation Consultation Note  Patient Name: Jessica Bryant Today's Date: 04/22/2018 Reason for consult: Initial assessment;1st time breastfeeding;Term 11 hour female infant, very sleepy LC notice infant is jittery and  rapid breathing notice.  Alert nurse and  blood sugar test ordered. Infant with mongolian spots and ? bruising.  Infant very sleepy at breast and attempts  to get baby to nurse STS, express colostrum on lips, finger suck for stimulation but infant no-responsive to stimulation. Mom has large flat nipples , given hand pump to pre-pump breast and placed w/ breast shells . Mom expressed few drops of colostrum from left breast. Mom will continue  with STS and mom will pumped 15 minutes  DEBP . Mom knows after latching baby to breast to pump q3h for 15-20 min. Mom shown how to use DEBP & how to disassemble, clean, & reassemble parts. Mom encouraged to feed baby 8-12 times/24 hours and with feeding cues.  Mom encouraged to feed baby w/feeding cues. Mom will call LC to help w/latch. Mom made aware of O/P services, breastfeeding support groups, community resources, and our phone # for post-discharge questions.   Maternal Data Formula Feeding for Exclusion: No Has patient been taught Hand Expression?: Yes Does the patient have breastfeeding experience prior to this delivery?: No  Feeding Feeding Type: Breast Fed Length of feed: 0 min  LATCH Score Latch: Too sleepy or reluctant, no latch achieved, no sucking elicited.  Audible Swallowing: None  Type of Nipple: Flat  Comfort (Breast/Nipple): Soft / non-tender  Hold (Positioning): Assistance needed to correctly position infant at breast and maintain latch.  LATCH Score: 4  Interventions Interventions: Breast feeding basics reviewed;Assisted with latch;Skin to skin;Breast compression;Shells;Adjust position;Breast massage;Support pillows;Hand pump;Hand express;Position  options;DEBP;Pre-pump if needed;Expressed milk  Lactation Tools Discussed/Used Tools: Shells Breast pump type: Double-Electric Breast Pump;Manual WIC Program: Yes Pump Review: Setup, frequency, and cleaning Initiated by:: Jessica Bryant, Jessica Bryant Date initiated:: 04/22/18   Consult Status Consult Status: Follow-up Date: 04/22/18 Follow-up type: In-patient    Jessica Bryant 04/22/2018, 11:13 AM

## 2018-04-22 NOTE — Progress Notes (Signed)
Post Partum Day 1 Subjective: no complaints, up ad lib, voiding, tolerating PO and nl lochia, pain controlled  Objective: Blood pressure 131/81, pulse 81, temperature 98.8 F (37.1 C), temperature source Oral, resp. rate 20, height 5\' 10"  (1.778 m), weight (!) 171 kg (377 lb), last menstrual period 07/20/2017, SpO2 98 %, unknown if currently breastfeeding.  Physical Exam:  General: alert and no distress Lochia: appropriate Uterine Fundus: firm  Recent Labs    04/21/18 0734 04/22/18 0554  HGB 11.1* 10.4*  HCT 34.3* 31.8*    Assessment/Plan: Plan for discharge tomorrow, Breastfeeding and Lactation consult.  Routine PP care   LOS: 1 day   Jessica Bryant 04/22/2018, 8:02 AM

## 2018-04-22 NOTE — Discharge Summary (Signed)
OB Discharge Summary     Patient Name: Jessica Bryant DOB: 03/16/1981 MRN: [id number]  Date of admission: 04/21/2018 Delivering MD: Pryor OchoaBANGA, CECILIA [facility]   Date of discharge: 04/23/2018  Admitting diagnosis: INDUCTION Intrauterine pregnancy: 6026w2d     Secondary diagnosis:  Active Problems:   Pregnancy   SVD (spontaneous vaginal delivery)   Postpartum care following vaginal delivery  Additional problems: none      Discharge diagnosis: Term Pregnancy Delivered                                                                                                Post partum procedures:  none  Augmentation: AROM and Pitocin  Complications: None  Hospital course:  Induction of Labor With Vaginal Delivery   37 y.o. yo G1P1001 at 4826w2d was admitted to the hospital 04/21/2018 for induction of labor.  Indication for induction: Favorable cervix at term.  Patient had an uncomplicated labor course as follows: Membrane Rupture Time/Date: 9:41 AM ,04/21/2018   Intrapartum Procedures: Episiotomy: None [1]                                         Lacerations:  None [1]  Patient had delivery of a Viable infant.  Information for the patient's newborn:  Carlynn PurlGill, Girl Caliegh [[id number]][[id number]]      04/21/2018  Details of delivery can be found in separate delivery note.  Patient had a routine postpartum course. Patient is discharged home 04/23/18.  Physical exam  Vitals:   04/22/18 1126 04/22/18 1430 04/22/18 2159 04/23/18 0555  BP: 120/76 126/72 (!) 142/92 128/82  Pulse: 75 81 67 79  Resp:  20 (!) 22 18  Temp: 98.2 F (36.8 C) 98.4 F (36.9 C) 98.1 F (36.7 C) 98.2 F (36.8 C)  TempSrc: Oral Oral Oral Oral  SpO2:      Weight:      Height:       General: alert and cooperative Lochia: appropriate Uterine Fundus: soft   Labs: Lab Results  Component Value Date   WBC 13.2 (H) 04/22/2018   HGB 10.4 (L) 04/22/2018   HCT 31.8 (L) 04/22/2018   MCV 81.3 04/22/2018   PLT 196 04/22/2018   No  flowsheet data found.  Discharge instruction: per After Visit Summary and "Baby and Me Booklet".  After visit meds:  Allergies as of 04/23/2018   No Known Allergies     Medication List    TAKE these medications   acetaminophen 325 MG tablet Commonly known as:  TYLENOL Take 2 tablets (650 mg total) by mouth every 4 (four) hours as needed (for pain scale < 4). What changed:    medication strength  how much to take  when to take this  reasons to take this   FUSION PLUS Caps Take 1 capsule by mouth daily.   ibuprofen 600 MG tablet Commonly known as:  ADVIL,MOTRIN Take 1 tablet (600 mg total) by mouth every 6 (six) hours.   PRENATAL VITAMIN PO Take  1 tablet by mouth daily.       Diet: routine diet  Activity: Advance as tolerated. Pelvic rest for 6 weeks.   Outpatient follow up:6 weeks Follow up Appt:No future appointments. Follow up Visit:No follow-ups on file.  Postpartum contraception: Undecided  Newborn Data: Live born female  Birth Weight: 7 lb 8.1 oz (3405 g) APGAR: 9, 9  Newborn Delivery   Birth date/time:  04/21/2018 23:00:00 Delivery type:  Vaginal, Spontaneous     Baby Feeding: Breast Disposition:home with mother   04/23/2018 Oliver Pila, MD

## 2018-04-22 NOTE — Lactation Note (Signed)
This note was copied from a baby's chart. Lactation Consultation Note  Patient Name: Jessica Bryant GNFAO'ZToday's Date: 04/22/2018 Reason for consult: Follow-up assessment;Term  P1 mother whose infant is now 5221 hours old.  RN had requested for me to visit due to sleepy baby.  Mother in bed resting and baby sleeping in bassinet when I arrived.  Infant has been sleepy according to mother and has had a little bit of difficulty latching on due to flatter nipples.  Mother has breast shells but is not wearing them.  I encouraged her to start wearing them now but not during sleep.  She also has a manual pump and a DEBP at bedside.  I reminded her to pre-pump with manual pump prior to latching and to use the DEBP for 15 minutes after feeding.  I offered to assist with latch the next time baby shows cues.  Mother will call for assistance as needed.  Encouraged feeding 8-12 times/24 hours or more if baby shows cues.  Mother will pre-pump prior to latching and will post pump after feeding.  She will spoon feed any EBM she obtains from hand expression or pumping.  She is familiar with hand expression.  Father present.  RN updated.   Maternal Data Formula Feeding for Exclusion: No Has patient been taught Hand Expression?: Yes Does the patient have breastfeeding experience prior to this delivery?: No  Feeding Feeding Type: Breast Milk Length of feed: 10 min  LATCH Score Latch: Repeated attempts needed to sustain latch, nipple held in mouth throughout feeding, stimulation needed to elicit sucking reflex.  Audible Swallowing: A few with stimulation  Type of Nipple: Flat  Comfort (Breast/Nipple): Soft / non-tender  Hold (Positioning): Assistance needed to correctly position infant at breast and maintain latch.  LATCH Score: 6  Interventions Interventions: Assisted with latch;Breast compression;Adjust position  Lactation Tools Discussed/Used     Consult Status Consult Status: Follow-up Date:  04/23/18 Follow-up type: In-patient    Dora SimsBeth R Jerrianne Hartin 04/22/2018, 8:53 PM

## 2018-04-23 MED ORDER — IBUPROFEN 600 MG PO TABS: 600.0000 mg | ORAL_TABLET | Freq: Four times a day (QID) | ORAL | 0 refills | Status: AC

## 2018-04-23 MED ORDER — ACETAMINOPHEN 325 MG PO TABS: 650.0000 mg | ORAL_TABLET | ORAL | 0 refills | Status: AC | PRN

## 2018-04-23 NOTE — Lactation Note (Signed)
This note was copied from a baby's chart. Lactation Consultation Note:   Infant is 2636 hours old. Staff nurse request assistance with latching infant . Discussed poor feeding with staff nurse who reports that infant has not had a good feeding since life.  Suggested that infant be supplemented with formula if mother unable to obtain any breast milk when hand express or pump.   Assist mother with sitting up in chair at the bedside. Mother has compressible breast tissue with erect nipples.  Infant latched on with the assistance of tea-cup hold, and observed suckling on and off . Repeated attempts to get infant to sustain latch . infant on and off with a few short burst of suckles. Infants suck assessed with glove finger. Infant tongue thrust finger.   #24 Nipple Shield in place with nipple shield pre-filled with formula. Infant tugged strongly , but latch poor . Observed that mothers nipple was compressed when infant released the breast.   Infant was finger fed 10 ml with a glove finger and curved tip syringe.   Advised mother to continue to offer breast and supplement infant with ebm/formula according to AAP guidelines with each feeding.  Mother receptive to all teaching. Advised mother to page staff nurse for assistance with each feeding.      Patient Name: Jessica Bryant Treat ZOXWR'UToday's Date: 04/23/2018 Reason for consult: Follow-up assessment   Maternal Data    Feeding Feeding Type: Formula  LATCH Score                   Interventions Interventions: Assisted with latch;Skin to skin;Hand express;Breast compression;Adjust position;Support pillows;Position options;Expressed milk;Shells;DEBP  Lactation Tools Discussed/Used     Consult Status Consult Status: Follow-up Date: 04/24/18 Follow-up type: In-patient    Stevan BornKendrick, Anai Lipson [facility] 04/23/2018, 3:25 PM

## 2018-04-23 NOTE — Progress Notes (Signed)
Post Partum Day 2 Subjective: no complaints, up ad lib and tolerating PO  Objective: Blood pressure 128/82, pulse 79, temperature 98.2 F (36.8 C), temperature source Oral, resp. rate 18, height 5\' 10"  (1.778 m), weight (!) 171 kg (377 lb), last menstrual period 07/20/2017, SpO2 98 %, unknown if currently breastfeeding.  Physical Exam:  General: alert and cooperative Lochia: appropriate Uterine Fundus: firm   Recent Labs    04/21/18 0734 04/22/18 0554  HGB 11.1* 10.4*  HCT 34.3* 31.8*    Assessment/Plan: Discharge home  One BP 140/92 but remainder WNL Breastfeeding and will see lactation before d/c   LOS: 2 days   Oliver PilaKathy W Ayushi Pla 04/23/2018, 9:40 AM

## 2018-04-23 NOTE — Anesthesia Postprocedure Evaluation (Signed)
Anesthesia Post Note  Patient: Jessica DawsonEvette T Bryant  Procedure(s) Performed: AN AD HOC LABOR EPIDURAL     Patient location during evaluation: Mother Baby Anesthesia Type: Epidural Level of consciousness: awake and alert Pain management: pain level controlled Vital Signs Assessment: post-procedure vital signs reviewed and stable Respiratory status: spontaneous breathing, nonlabored ventilation and respiratory function stable Cardiovascular status: stable Postop Assessment: no headache, no backache and epidural receding Anesthetic complications: no Comments: Chart review only    Last Vitals:  Vitals:   04/22/18 2159 04/23/18 0555  BP: (!) 142/92 128/82  Pulse: 67 79  Resp: (!) 22 18  Temp: 36.7 C 36.8 C  SpO2:      Last Pain:  Vitals:   04/23/18 0740  TempSrc:   PainSc: Asleep                 Trevor IhaStephen A Houser

## 2018-04-24 ENCOUNTER — Ambulatory Visit: Payer: Self-pay

## 2018-04-24 NOTE — Lactation Note (Signed)
This note was copied from a baby's chart. Lactation Consultation Note; Mom called out for assist. Baby awake and fussy. Mom easily able to hand express transitional milk. Baby would take a few sucks then go to sleep. Repeated effort to get her to latch. Attempted with 5 Fr feeding tube at the breast but baby would not suck. Mom finger fed with feeding tube/syringe and baby took 20 ml total. Mom reports this is easier than curved tip syringe. Encouraged to call for assist prn. Lauren NP in- will probably stay another day due to weight loss and feeding difficulty. Encouraged to pump now and can feed EBM in feeding tube rather than formula when available. No questions at present.   Patient Name: Jessica Bryant ZOXWR'UToday's Date: 04/24/2018 Reason for consult: Follow-up assessment   Maternal Data Formula Feeding for Exclusion: No Has patient been taught Hand Expression?: Yes Does the patient have breastfeeding experience prior to this delivery?: No  Feeding Feeding Type: Breast Fed Length of feed: (Few sucks)  LATCH Score Latch: Too sleepy or reluctant, no latch achieved, no sucking elicited.(few sucks)  Audible Swallowing: None  Type of Nipple: Everted at rest and after stimulation  Comfort (Breast/Nipple): Soft / non-tender  Hold (Positioning): Assistance needed to correctly position infant at breast and maintain latch.  LATCH Score: 5  Interventions Interventions: Breast feeding basics reviewed  Lactation Tools Discussed/Used Tools: Pump;Nipple Juanita LasterShields Shell Type: Inverted Breast pump type: Double-Electric Breast Pump;Manual WIC Program: Yes   Consult Status Consult Status: Follow-up Date: 04/24/18 Follow-up type: In-patient    Pamelia HoitWeeks, Kairon Shock D 04/24/2018, 10:06 AM

## 2018-04-24 NOTE — Lactation Note (Signed)
This note was copied from a baby's chart. Lactation Consultation Note: Mom reports breast feeding is going better. Reports she feed every hour for 10 min since 1 am . Last fed at 6:30 am for 10 min. Now asleep in bassinet. Mom has not pumped or supplemented since last evening. Weight down 3 oz last night ( to 9 %). Reports breasts do not feel any fuller this morning. Has been using NS. Mom does not want to wake baby at this time for feeding since she was up so much through the night. Encouraged to page for assist when baby wakes for next feeding. No questions at present.   Patient Name: Jessica Bryant EXBMW'UToday's Date: 04/24/2018 Reason for consult: Follow-up assessment;Primapara   Maternal Data Formula Feeding for Exclusion: No Has patient been taught Hand Expression?: Yes Does the patient have breastfeeding experience prior to this delivery?: No  Feeding    LATCH Score                   Interventions    Lactation Tools Discussed/Used Tools: Pump;Nipple Juanita LasterShields Shell Type: Inverted Breast pump type: Double-Electric Breast Pump;Manual WIC Program: Yes   Consult Status Consult Status: Follow-up Date: 04/24/18 Follow-up type: In-patient    Pamelia HoitWeeks, [name] 04/24/2018, 7:45 AM

## 2018-04-25 ENCOUNTER — Ambulatory Visit: Payer: Self-pay

## 2018-04-25 NOTE — Lactation Note (Signed)
This note was copied from a baby's chart. Lactation Consultation Note  Patient Name: Jessica Bryant ZOXWR'UToday's Date: 04/25/2018 Reason for consult: Follow-up assessment;Difficult latch(sleepy baby)  P1 mother whose infant is now 2684 hours old.  Mother is anticipating discharge today after baby feeds one more time.  Mother has had some difficulty with getting baby to latch at the breast.  She states that the left breast has been easier than the right breast.  Baby gets very sleepy and will not suck for any length of time.  Mother has been pumping with the DEBP and doing hand expression.  She has doubled the amount of milk to 10 mls at today's pumping as compared to 5 mls from yesterday's pumping.  She is also supplementing with formula.  The pediatrician has recommended that mother stay for one more feeding before being discharged.  Encouraged mother to continue feeding 8-12 times/24 hours or more if baby shows feeding cues.  Continue STS, breast massage and hand expression before and after feedings.  Encouraged pumping for 15 minutes after every feeding to help increase milk supply for supplementation for baby.  [facility] mother before she is discharged.  Milk storage times reviewed and showed mother where to find these times in her booklet.  Mother will be getting a deep freezer for home use.    Reviewed how to awaken a sleepy baby, voiding/stooling, cues, engorgement prevention/treatment and how to call for OP visit if needed.  Mother had some questions that I answered to her satisfaction.  No further questions/concerns at this time.  Mother seems like she is retaining all the information that has been provided regarding breastfeeding and is eager to help improve her milk supply and assist with effective feeding for baby.     Maternal Data Formula Feeding for Exclusion: No Has patient been taught Hand Expression?: Yes Does the patient have breastfeeding experience prior  to this delivery?: No  Feeding Feeding Type: Formula  LATCH Score                   Interventions    Lactation Tools Discussed/Used Tools: Pump WIC Program: Yes   Consult Status Consult Status: Complete Date: 04/25/18 Follow-up type: In-patient    Linzie Criss R Dazia Lippold 04/25/2018, 11:17 AM

## 2018-04-25 NOTE — Lactation Note (Signed)
This note was copied from a baby's chart. Lactation Consultation Note  Patient Name: Jessica Bryant AVWUJ'WToday's Date: 04/25/2018 Reason for consult: Follow-up assessment;Difficult latch(sleepy baby)  LC Visit:  [facility] given: #[id number] Mother given patient copy of form and money taken to office RN Updated and mother will be ready for discharge.   Aimar Borghi R Amaria Mundorf 04/25/2018, 2:26 PM

## 2020-05-10 ENCOUNTER — Ambulatory Visit (INDEPENDENT_AMBULATORY_CARE_PROVIDER_SITE_OTHER): Payer: Managed Care, Other (non HMO)

## 2020-05-10 ENCOUNTER — Other Ambulatory Visit: Payer: Self-pay

## 2020-05-10 DIAGNOSIS — Z23 Encounter for immunization: Secondary | ICD-10-CM

## 2020-05-10 NOTE — Progress Notes (Signed)
   Covid-19 Vaccination Clinic  Name:  Jessica Bryant    MRN: [id number] DOB: Oct 02, 1981  05/10/2020  [name] was observed post Covid-19 immunization for 15 minutes without incident. She was provided with Vaccine Information Sheet and instruction to access the V-Safe system.   [name] was instructed to call 911 with any severe reactions post vaccine: Marland Kitchen Difficulty breathing  . Swelling of face and throat  . A fast heartbeat  . A bad rash all over body  . Dizziness and weakness   Immunizations Administered    Name Date Dose VIS Date Route   Pfizer COVID-19 Vaccine 05/10/2020 10:35 AM 0.3 mL 11/30/2018 Intramuscular   Manufacturer: [employer], [employer]   Lot: [id number]   NDC: [address]-0

## 2021-02-06 ENCOUNTER — Other Ambulatory Visit: Payer: Self-pay | Admitting: Obstetrics and Gynecology

## 2021-02-06 DIAGNOSIS — R928 Other abnormal and inconclusive findings on diagnostic imaging of breast: Secondary | ICD-10-CM

## 2021-03-01 ENCOUNTER — Ambulatory Visit
Admission: RE | Admit: 2021-03-01 | Discharge: 2021-03-01 | Disposition: A | Discharge status: Home / Self Care | Payer: Managed Care, Other (non HMO) | Source: Ambulatory Visit | Attending: Obstetrics and Gynecology | Admitting: Obstetrics and Gynecology

## 2021-03-01 ENCOUNTER — Ambulatory Visit
Admission: RE | Admit: 2021-03-01 | Discharge: 2021-03-01 | Disposition: A | Discharge status: Home / Self Care | Payer: Medicaid Other | Source: Ambulatory Visit | Attending: Obstetrics and Gynecology | Admitting: Obstetrics and Gynecology

## 2021-03-01 ENCOUNTER — Other Ambulatory Visit: Payer: Self-pay | Admitting: Obstetrics and Gynecology

## 2021-03-01 ENCOUNTER — Other Ambulatory Visit: Payer: Self-pay

## 2021-03-01 DIAGNOSIS — R928 Other abnormal and inconclusive findings on diagnostic imaging of breast: Secondary | ICD-10-CM

## 2022-03-06 ENCOUNTER — Other Ambulatory Visit: Payer: Self-pay | Admitting: Obstetrics and Gynecology

## 2022-03-06 DIAGNOSIS — N6489 Other specified disorders of breast: Secondary | ICD-10-CM

## 2022-03-13 ENCOUNTER — Ambulatory Visit
Admission: RE | Admit: 2022-03-13 | Discharge: 2022-03-13 | Disposition: A | Discharge status: Home / Self Care | Payer: Managed Care, Other (non HMO) | Source: Ambulatory Visit | Attending: Obstetrics and Gynecology | Admitting: Obstetrics and Gynecology

## 2022-03-13 DIAGNOSIS — N6489 Other specified disorders of breast: Secondary | ICD-10-CM

## 2023-03-19 ENCOUNTER — Other Ambulatory Visit: Payer: Self-pay | Admitting: Obstetrics and Gynecology

## 2023-03-19 DIAGNOSIS — N6489 Other specified disorders of breast: Secondary | ICD-10-CM

## 2023-04-02 ENCOUNTER — Ambulatory Visit
Admission: RE | Admit: 2023-04-02 | Discharge: 2023-04-02 | Disposition: A | Discharge status: Home / Self Care | Payer: Managed Care, Other (non HMO) | Source: Ambulatory Visit | Attending: Obstetrics and Gynecology | Admitting: Obstetrics and Gynecology

## 2023-04-02 ENCOUNTER — Other Ambulatory Visit: Payer: Self-pay | Admitting: Obstetrics and Gynecology

## 2023-04-02 DIAGNOSIS — N6489 Other specified disorders of breast: Secondary | ICD-10-CM

## 2023-04-06 ENCOUNTER — Other Ambulatory Visit: Payer: Self-pay | Admitting: Obstetrics and Gynecology

## 2023-04-06 DIAGNOSIS — N6489 Other specified disorders of breast: Secondary | ICD-10-CM

## 2023-10-12 ENCOUNTER — Other Ambulatory Visit: Payer: Managed Care, Other (non HMO)

## 2023-10-20 ENCOUNTER — Other Ambulatory Visit: Payer: Self-pay | Admitting: Obstetrics and Gynecology

## 2023-10-20 ENCOUNTER — Ambulatory Visit
Admission: RE | Admit: 2023-10-20 | Discharge: 2023-10-20 | Disposition: A | Discharge status: Home / Self Care | Payer: Managed Care, Other (non HMO) | Source: Ambulatory Visit | Attending: Obstetrics and Gynecology

## 2023-10-20 DIAGNOSIS — N6489 Other specified disorders of breast: Secondary | ICD-10-CM

## 2023-11-14 IMAGING — MG DIGITAL DIAGNOSTIC BILAT W/ TOMO W/ CAD
8 of 15 series · 8 of 40 positions shown · non-contrast
Comparison: Previous exams including diagnostic mammogram and
ultrasound dated 03/01/2021.

CLINICAL DATA: Follow-up for probably benign asymmetry within the
LEFT breast.

EXAM:
DIGITAL DIAGNOSTIC BILATERAL MAMMOGRAM WITH TOMOSYNTHESIS AND CAD
TECHNIQUE: Bilateral digital diagnostic mammography and breast tomosynthesis
was performed. The images were evaluated with computer-aided
detection.

[R CC synth-2D (1 of 2)]
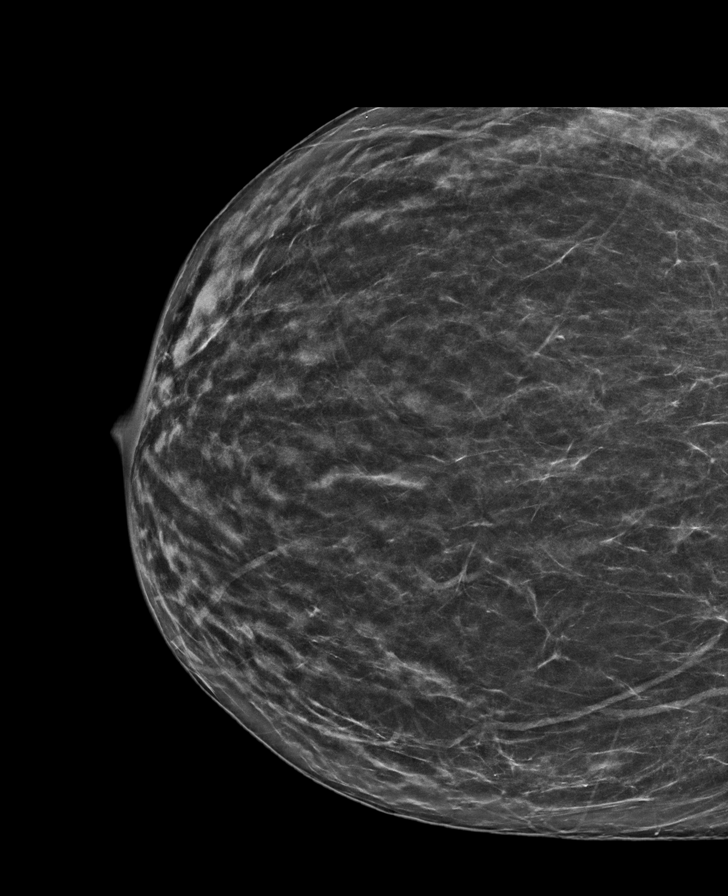

[L MLO synth-2D (1 of 2)]
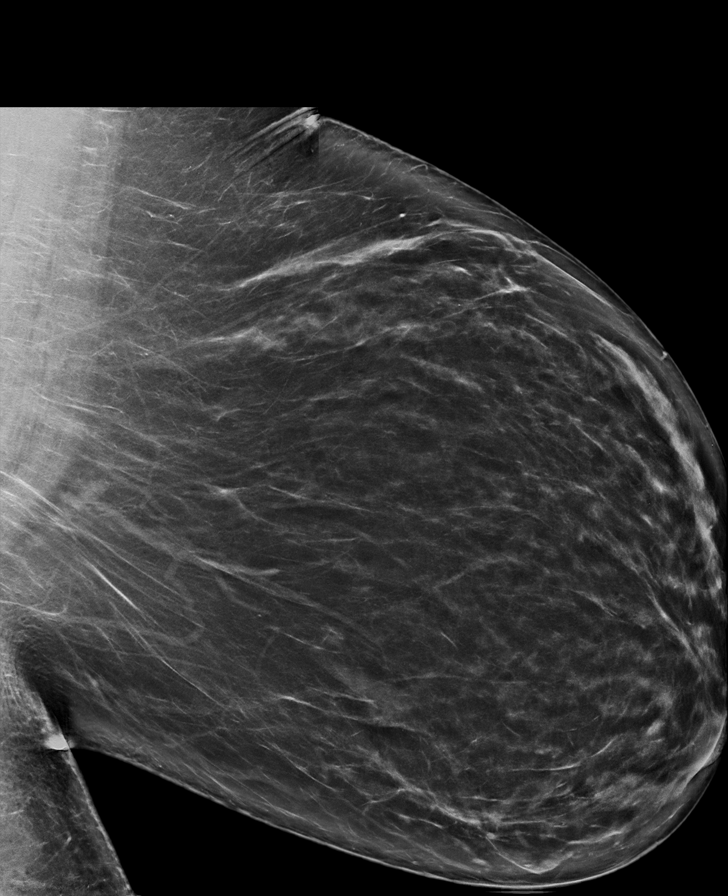

[R MLO synth-2D (1 of 2)]
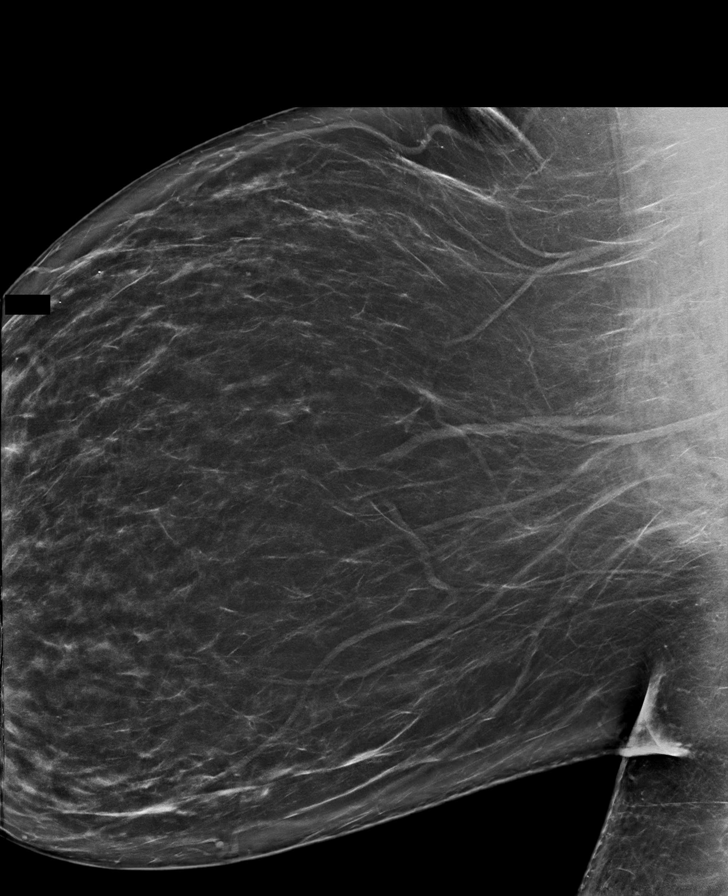

[R MLO synth-2D (2 of 2)]
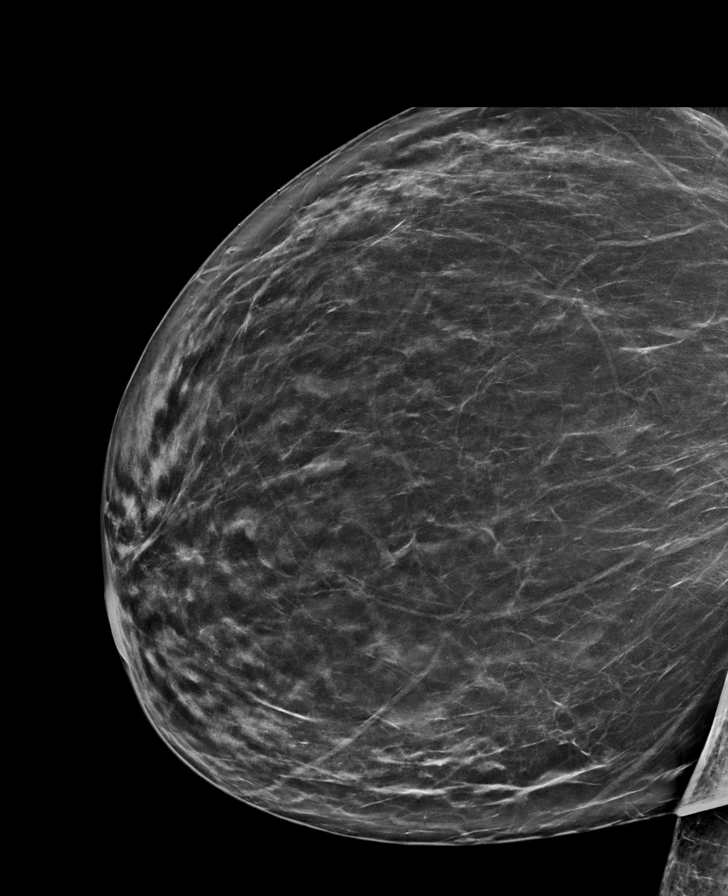

[R CC synth-2D (2 of 2)]
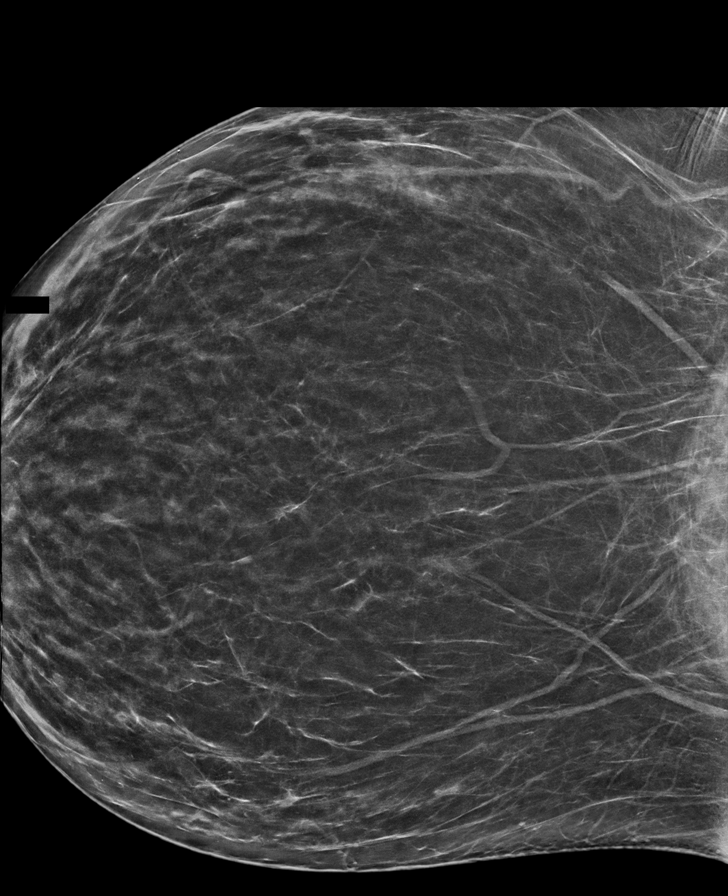

[L CC synth-2D]
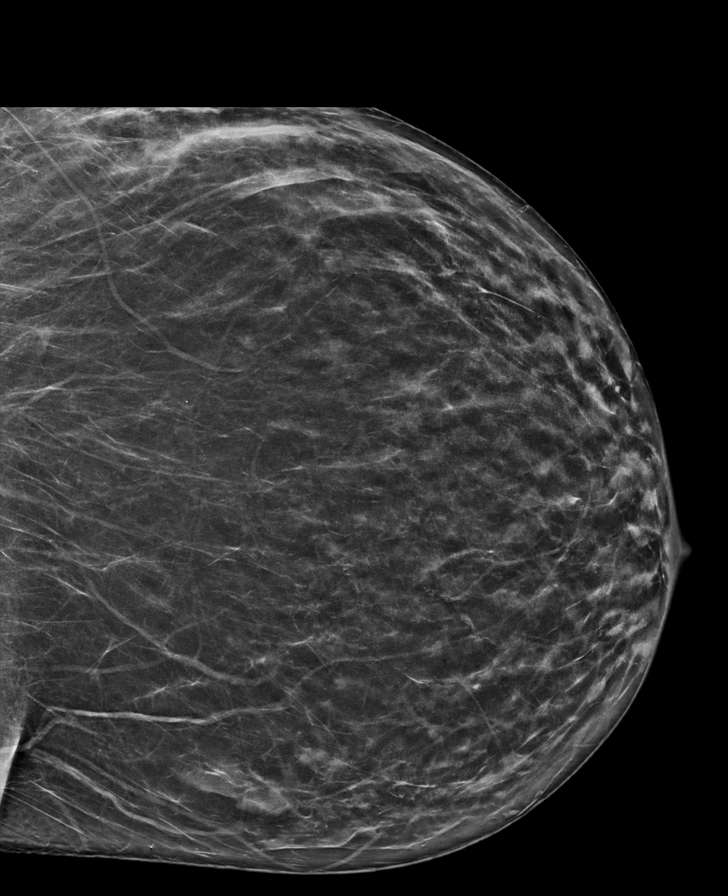

[L MLO synth-2D (2 of 2)]
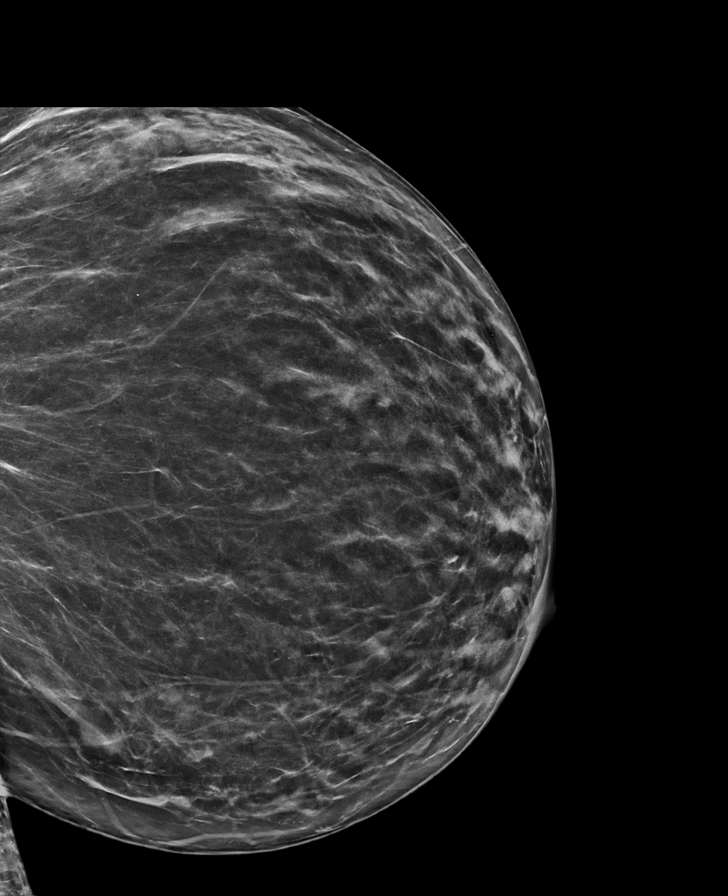

[R CC tomo · tomo slice 49/72.0]
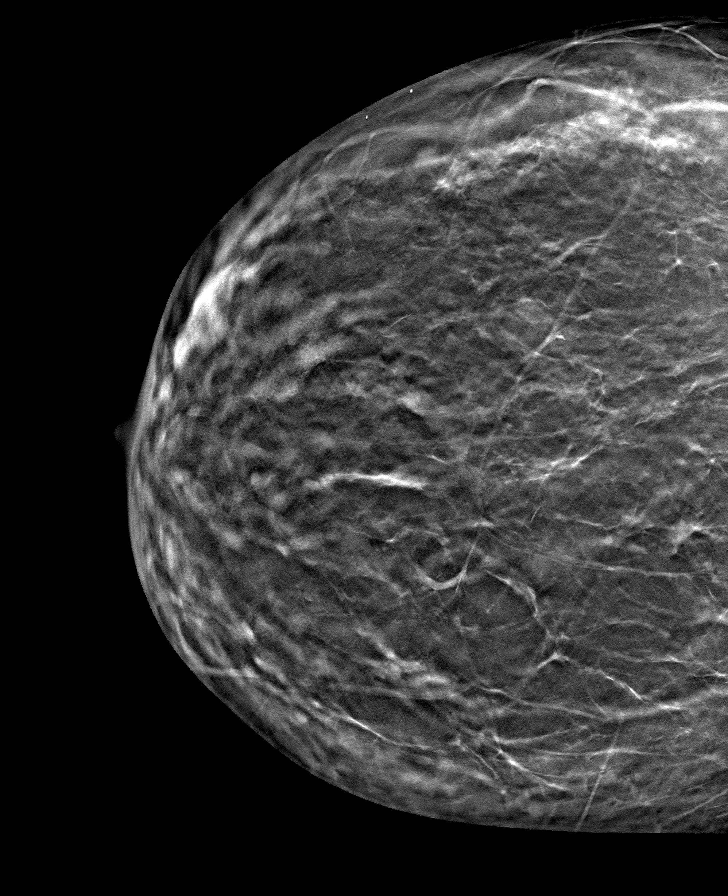

[8 of 40 positions shown; findings below may reference images not displayed]

ACR Breast Density Category b: There are scattered areas of
fibroglandular density.
FINDINGS: The focal asymmetry within the inner LEFT breast is stable. There
are no new dominant masses, suspicious calcifications or secondary
signs of malignancy elsewhere within either breast.
IMPRESSION: 1. Stable probably benign asymmetry within the inner LEFT breast,
most likely an island of normal fibroglandular tissue. Recommend
additional follow-up diagnostic mammogram in 12 months to ensure 2
year stability.
2. No evidence of malignancy within the RIGHT breast.

RECOMMENDATION:
Bilateral diagnostic mammogram in 12 months.

I have discussed the findings and recommendations with the patient.
If applicable, a reminder letter will be sent to the patient
regarding the next appointment.

BI-RADS CATEGORY  3: Probably benign.

## 2024-01-05 ENCOUNTER — Encounter: Payer: Self-pay | Admitting: Obstetrics and Gynecology

## 2024-04-21 ENCOUNTER — Ambulatory Visit
Admission: RE | Admit: 2024-04-21 | Discharge: 2024-04-21 | Disposition: A | Discharge status: Home / Self Care | Source: Ambulatory Visit | Attending: Obstetrics and Gynecology

## 2024-04-21 ENCOUNTER — Ambulatory Visit
Admission: RE | Admit: 2024-04-21 | Discharge: 2024-04-21 | Disposition: A | Discharge status: Home / Self Care | Source: Ambulatory Visit | Attending: Obstetrics and Gynecology | Admitting: Obstetrics and Gynecology

## 2024-04-21 DIAGNOSIS — N6489 Other specified disorders of breast: Secondary | ICD-10-CM

## 2024-11-11 ENCOUNTER — Ambulatory Visit
Admission: EM | Admit: 2024-11-11 | Discharge: 2024-11-11 | Disposition: A | Discharge status: Home / Self Care | Source: Home / Self Care

## 2024-11-11 ENCOUNTER — Encounter: Payer: Self-pay | Admitting: Emergency Medicine

## 2024-11-11 DIAGNOSIS — E669 Obesity, unspecified: Secondary | ICD-10-CM | POA: Insufficient documentation

## 2024-11-11 DIAGNOSIS — O321XX0 Maternal care for breech presentation, not applicable or unspecified: Secondary | ICD-10-CM | POA: Insufficient documentation

## 2024-11-11 DIAGNOSIS — O09529 Supervision of elderly multigravida, unspecified trimester: Secondary | ICD-10-CM | POA: Insufficient documentation

## 2024-11-11 DIAGNOSIS — H8110 Benign paroxysmal vertigo, unspecified ear: Secondary | ICD-10-CM

## 2024-11-11 DIAGNOSIS — N289 Disorder of kidney and ureter, unspecified: Secondary | ICD-10-CM | POA: Insufficient documentation

## 2024-11-11 DIAGNOSIS — A599 Trichomoniasis, unspecified: Secondary | ICD-10-CM | POA: Insufficient documentation

## 2024-11-11 MED ORDER — ONDANSETRON 4 MG PO TBDP
4.0000 mg | ORAL_TABLET | Freq: Once | ORAL | Status: AC
  Administered 2024-11-11: 4 mg via ORAL

## 2024-11-11 MED ORDER — MECLIZINE HCL 25 MG PO TABS: 25.0000 mg | ORAL_TABLET | Freq: Three times a day (TID) | ORAL | 0 refills | Status: AC | PRN

## 2024-11-11 MED ORDER — MECLIZINE HCL 25 MG PO TABS
25.0000 mg | ORAL_TABLET | Freq: Once | ORAL | Status: AC
  Administered 2024-11-11: 25 mg via ORAL

## 2024-11-11 NOTE — ED Provider Notes (Signed)
 " [facility]    CSN: [id number] Arrival date & time: 11/11/24  1226      History   Chief Complaint Chief Complaint  Patient presents with   Dizziness   Emesis    HPI Jessica Bryant is a 44 y.o. female.   Pt presents today due to dizziness that started yesterday and dizziness and nausea today. Pt states that she feels as if she is rocking on a boat. Pt states that her dizziness worsens with turning head from side to side and sitting up from supine position. Pt denies blurred vision, headache, chest pain, shortness of breath, or upper back pain. Pt denies any type of pain.   The history is provided by the patient.  Dizziness Associated symptoms: vomiting   Emesis   Past Medical History:  Diagnosis Date   AMA (advanced maternal age) multigravida 35+    Anemia    SVD (spontaneous vaginal delivery) 04/21/2018    Patient Active Problem List   Diagnosis Date Noted   Advanced maternal age in multigravida 11/11/2024   Breech presentation 11/11/2024   Kidney disease 11/11/2024   Obesity 11/11/2024   Trichomoniasis 11/11/2024   Postpartum care following vaginal delivery 04/22/2018   Pregnancy 04/21/2018   SVD (spontaneous vaginal delivery) 04/21/2018   Advanced maternal age, primigravida 12/04/2017   [redacted] weeks gestation of pregnancy    Anemia 10/22/2017    Past Surgical History:  Procedure Laterality Date   BONY PELVIS SURGERY     WRIST SURGERY  2002    OB History     Gravida  1   Para  1   Term  1   Preterm      AB      Living  1      SAB      IAB      Ectopic      Multiple  0   Live Births  1            Home Medications    Prior to Admission medications  Medication Sig Start Date End Date Taking? Authorizing Provider  levonorgestrel (MIRENA, 52 MG,) 20 MCG/DAY IUD Take 1 device by intrauterine route. 06/03/18  Yes [provider]  acetaminophen  (TYLENOL ) 325 MG tablet Take 2 tablets (650 mg total) by mouth every 4  (four) hours as needed (for pain scale < 4). 04/23/18   Estelle Service, MD  ibuprofen  (ADVIL ,MOTRIN ) 600 MG tablet Take 1 tablet (600 mg total) by mouth every 6 (six) hours. 04/23/18   Estelle Service, MD  Iron-FA-B Cmp-C-Biot-Probiotic (FUSION PLUS) CAPS Take 1 capsule by mouth daily.    [provider]  Prenatal Vit-Fe Fumarate-FA (PRENATAL VITAMIN PO) Take 1 tablet by mouth daily.     [provider]    Family History Family History  Problem Relation Age of Onset   Heart disease Mother    Cancer Maternal Grandmother     Social History Social History[1]   Allergies   Patient has no known allergies.   Review of Systems Review of Systems  Gastrointestinal:  Positive for vomiting.  Neurological:  Positive for dizziness.     Physical Exam Triage Vital Signs ED Triage Vitals  Encounter Vitals Group     BP 11/11/24 1337 133/87     Girls Systolic BP Percentile --      Girls Diastolic BP Percentile --      Boys Systolic BP Percentile --      Boys Diastolic BP  Percentile --      Pulse Rate 11/11/24 1337 (!) 55     Resp 11/11/24 1337 18     Temp 11/11/24 1337 97.7 F (36.5 C)     Temp Source 11/11/24 1337 Oral     SpO2 11/11/24 1337 96 %     Weight 11/11/24 1335 (!) 376 lb 15.8 oz (171 kg)     Height --      Head Circumference --      Peak Flow --      Pain Score 11/11/24 1334 0     Pain Loc --      Pain Education --      Exclude from Growth Chart --    No data found.  Updated Vital Signs BP 133/87 (BP Location: Left Arm)   Pulse (!) 55   Temp 97.7 F (36.5 C) (Oral)   Resp 18   Wt (!) 376 lb 15.8 oz (171 kg)   LMP 11/05/2024 (Exact Date)   SpO2 96%   Breastfeeding No   BMI 54.09 kg/m   Visual Acuity Right Eye Distance:   Left Eye Distance:   Bilateral Distance:    Right Eye Near:   Left Eye Near:    Bilateral Near:     Physical Exam   UC Treatments / Results  Labs (all labs ordered are listed, but only abnormal results are  displayed) Labs Reviewed - No data to display  EKG   Radiology No results found.  Procedures Procedures (including critical care time)  Medications Ordered in UC Medications  ondansetron  (ZOFRAN -ODT) disintegrating tablet 4 mg (4 mg Oral Given 11/11/24 1348)  meclizine  (ANTIVERT ) tablet 25 mg (25 mg Oral Given 11/11/24 1351)    Initial Impression / Assessment and Plan / UC Course  I have reviewed the triage vital signs and the nursing notes.  Pertinent labs & imaging results that were available during my care of the patient were reviewed by me and considered in my medical decision making (see chart for details).     Final Clinical Impressions(s) / UC Diagnoses   Final diagnoses:  None   Discharge Instructions   None    ED Prescriptions   None    PDMP not reviewed this encounter.    [1]  Social History Tobacco Use   Smoking status: Never    Passive exposure: Never   Smokeless tobacco: Never  Vaping Use   Vaping status: Never Used  Substance Use Topics   Alcohol use: No   Drug use: No     Andra Corean BROCKS, PA-C 11/11/24 1409  "

## 2024-11-11 NOTE — ED Triage Notes (Signed)
 Pt presents c/o dizziness and emesis x 1 days. Pt states,  I started feeling dizzy yesterday. It felt like I was drunk but I hadn't been drinking. This morning I ate something then threw it up about 20 minutes later. Pt denies diarrhea or any additional sxs.
